# Patient Record
Sex: Female | Born: 1980 | Race: Black or African American | Hispanic: No | Smoking: Never smoker
Health system: Southern US, Community
[De-identification: ages and names within clinical notes are randomized; demographics above are authoritative.]

## PROBLEM LIST (undated history)

## (undated) DIAGNOSIS — E0590 Thyrotoxicosis, unspecified without thyrotoxic crisis or storm: Secondary | ICD-10-CM

## (undated) HISTORY — DX: Thyrotoxicosis, unspecified without thyrotoxic crisis or storm: E05.90

---

## 2019-05-05 ENCOUNTER — Ambulatory Visit: Payer: Self-pay | Admitting: "Endocrinology

## 2019-06-02 ENCOUNTER — Ambulatory Visit: Payer: Self-pay | Admitting: "Endocrinology

## 2019-06-23 ENCOUNTER — Other Ambulatory Visit: Payer: Self-pay

## 2019-06-23 ENCOUNTER — Encounter: Payer: Self-pay | Admitting: "Endocrinology

## 2019-06-23 ENCOUNTER — Ambulatory Visit (INDEPENDENT_AMBULATORY_CARE_PROVIDER_SITE_OTHER): Payer: BLUE CROSS/BLUE SHIELD | Admitting: "Endocrinology

## 2019-06-23 VITALS — BP 150/86 | HR 93 | Ht 68.0 in | Wt 261.0 lb

## 2019-06-23 DIAGNOSIS — E0590 Thyrotoxicosis, unspecified without thyrotoxic crisis or storm: Secondary | ICD-10-CM

## 2019-06-23 MED ORDER — PROPRANOLOL HCL 20 MG PO TABS: 20.0000 mg | ORAL_TABLET | Freq: Two times a day (BID) | ORAL | 2 refills | Status: DC

## 2019-06-23 NOTE — Progress Notes (Signed)
Endocrinology Consult Note    Subjective:    Patient ID: Stacy Castaneda, female    DOB: 09/27/1980, PCP Horner, Joette CatchingMargaret C, FNP.   Past Medical History:  Diagnosis Date  . Hyperthyroidism     History reviewed. No pertinent surgical history.  Social History   Socioeconomic History  . Marital status: Divorced    Spouse name: Not on file  . Number of children: Not on file  . Years of education: Not on file  . Highest education level: Not on file  Occupational History  . Not on file  Social Needs  . Financial resource strain: Not on file  . Food insecurity    Worry: Not on file    Inability: Not on file  . Transportation needs    Medical: Not on file    Non-medical: Not on file  Tobacco Use  . Smoking status: Not on file  Substance and Sexual Activity  . Alcohol use: Not on file  . Drug use: Not on file  . Sexual activity: Not on file  Lifestyle  . Physical activity    Days per week: Not on file    Minutes per session: Not on file  . Stress: Not on file  Relationships  . Social Musicianconnections    Talks on phone: Not on file    Gets together: Not on file    Attends religious service: Not on file    Active member of club or organization: Not on file    Attends meetings of clubs or organizations: Not on file    Relationship status: Not on file  Other Topics Concern  . Not on file  Social History Narrative  . Not on file    Family History  Problem Relation Age of Onset  . Diabetes Mother   . Diabetes Father     Outpatient Encounter Medications as of 06/23/2019  Medication Sig  . phentermine 37.5 MG capsule Take 37.5 mg by mouth every morning.  . furosemide (LASIX) 20 MG tablet Take 20 mg by mouth daily.  Marland Kitchen. olmesartan (BENICAR) 40 MG tablet daily.  . propranolol (INDERAL) 20 MG tablet Take 1 tablet (20 mg total) by mouth 2 (two) times daily.   No facility-administered encounter medications on file as of 06/23/2019.     ALLERGIES: Allergies  Allergen  Reactions  . Penicillins     VACCINATION STATUS:  There is no immunization history on file for this patient.   HPI  Stacy GravelRosalind Castaneda is 38 y.o. female who presents today with a medical history as above. she is being seen in consultation for hyperthyroidism requested by Horner, Joette CatchingMargaret C, FNP.  -History is obtained directly from the patient.  She no history of longstanding thyroid dysfunction.  she has been dealing with symptoms of weight loss of about 15 pounds over 6 months, palpitations, tremors, heat intolerance, and sleep disturbance progressively worsening over 6 months.  This symptoms have been progressively troubling to her functioning.  She was found to have suppressed TSH of <0.006 on January 15, 2019, and repeat thyroid function tests in May showed continued suppression of TSH and elevated total T4 of 12.8.  She had elevated titers of antithyroid antibodies.  she reports goiter, however she denies dysphagia, choking, shortness of breath, no recent voice change.   she denies family history of thyroid dysfunction, nor any family history of thyroid malignancy.   she is not on any anti-thyroid medications nor on any thyroid hormone supplements. she  is willing to  proceed with appropriate work up and therapy for thyrotoxicosis.                           Review of systems  Constitutional: + weight loss, + fatigue, + subjective hyperthermia Eyes: no blurry vision, ++ xerophthalmia ENT: no sore throat, no nodules palpated in throat, no dysphagia/odynophagia, nor hoarseness Cardiovascular: no Chest Pain, no Shortness of Breath, ++  palpitations, no leg swelling Respiratory: no cough, no SOB Gastrointestinal: no Nausea, no Vomiting, no Diarhhea Musculoskeletal: no muscle/joint aches Skin: no rashes Neurological: ++  tremors, no numbness, no tingling, no dizziness Psychiatric: no depression, ++  anxiety   Objective:    BP (!) 150/86   Pulse 93   Ht 5\' 8"  (1.727 m)   Wt 261 lb  (118.4 kg)   BMI 39.68 kg/m   Wt Readings from Last 3 Encounters:  06/23/19 261 lb (118.4 kg)                                                Physical exam  Constitutional: Body mass index is 39.68 kg/m.,  Anxious state of mind, not in acute distress Eyes: PERRLA, EOMI, - exophthalmos ENT: moist mucous membranes, ++  thyromegaly, no cervical lymphadenopathy Cardiovascular: ++ Hyperactive precordium, tachycardic, no Murmur/Rubs/Gallops Respiratory:  adequate breathing efforts, no gross chest deformity, Clear to auscultation bilaterally Gastrointestinal: abdomen soft, Non -tender, No distension, Bowel Sounds present Musculoskeletal: no gross deformities, strength intact in all four extremities Skin: moist, warm, no rashes Neurological: ++  tremor with outstretched hands,  ++ Deep Tendon Reflexes  on both lower extremities.   January 15, 2019 TSH , 0.006 Jan 22, 2019 TSH <0.006, total T4 12.8, TPO antibodies 298-elevated     Assessment & Plan:   1. Hyperthyroidism she is being seen at a kind request of Horner, 8 May, FNP. her history and most recent labs are reviewed, and she was examined clinically. Subjective and objective findings are consistent with thyrotoxicosis likely from primary hyperthyroidism. The potential risks of untreated thyrotoxicosis and the need for definitive therapy have been discussed in detail with her, and she agrees to proceed with diagnostic workup and treatment plan. -She seems to have significant thyroid hormone burden at this time, I  have emphasized the absolute need for follow-up and finish work-up and treatment.   I like to obtain confirmatory thyroid uptake and scan will be scheduled to be done as soon as possible.   Options of therapy are discussed with her.  We discussed the option of treating it with medications including methimazole or PTU which may have side effects including rash, transaminitis, and bone marrow suppression.  We  also discussed  the option of definitive therapy with RAI ablation of the thyroid, if needed.  -She understands the subsequent need for thyroid hormone replacement.  -If her thyroid uptake and scan reveals significant cold nodules, she will be considered for thyroid ultrasound before treating with I-131.  she will return in 1 week for treatment decision.   I will  initiate a temporary prescription for  Propranolol 20  mg po BID for symptomatic relief.  - I advised her to maintain close follow up with Horner, Joette Catching, FNP for primary care needs.   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 Please refer to " Patient Self Inventory" in the Media  tab for reviewed elements of pertinent patient history.  Arty Baumgartner participated in the discussions, expressed understanding, and voiced agreement with the above plans.  All questions were answered to her satisfaction. she is encouraged to contact clinic should she have any questions or concerns prior to her return visit.   Follow up plan: Return in about 1 week (around 06/30/2019), or Scan in [place] ASAP, for Follow up with Thyroid Uptake and Scan.   Thank you for involving me in the care of this pleasant patient, and I will continue to update you with her progress.  Glade Lloyd, MD [facility] Phone: [phone]  Fax: [phone]   06/23/2019, 4:33 PM  This note was partially dictated with voice recognition software. Similar sounding words can be transcribed inadequately or may not  be corrected upon review.

## 2019-07-02 ENCOUNTER — Encounter (HOSPITAL_COMMUNITY)
Admission: RE | Admit: 2019-07-02 | Discharge: 2019-07-02 | Disposition: A | Discharge status: Home / Self Care | Payer: BLUE CROSS/BLUE SHIELD | Source: Ambulatory Visit | Attending: "Endocrinology | Admitting: "Endocrinology

## 2019-07-02 ENCOUNTER — Other Ambulatory Visit: Payer: Self-pay

## 2019-07-02 DIAGNOSIS — E0590 Thyrotoxicosis, unspecified without thyrotoxic crisis or storm: Secondary | ICD-10-CM | POA: Insufficient documentation

## 2019-07-02 MED ORDER — SODIUM IODIDE I-123 7.4 MBQ CAPS
301.0000 | ORAL_CAPSULE | Freq: Once | ORAL | Status: AC
  Administered 2019-07-02: 301 via ORAL

## 2019-07-03 ENCOUNTER — Encounter (HOSPITAL_COMMUNITY)
Admission: RE | Admit: 2019-07-03 | Discharge: 2019-07-03 | Disposition: A | Discharge status: Home / Self Care | Payer: BLUE CROSS/BLUE SHIELD | Source: Ambulatory Visit | Attending: "Endocrinology | Admitting: "Endocrinology

## 2019-07-06 ENCOUNTER — Encounter: Payer: Self-pay | Admitting: "Endocrinology

## 2019-07-06 ENCOUNTER — Other Ambulatory Visit: Payer: Self-pay

## 2019-07-06 ENCOUNTER — Ambulatory Visit (INDEPENDENT_AMBULATORY_CARE_PROVIDER_SITE_OTHER): Payer: BLUE CROSS/BLUE SHIELD | Admitting: "Endocrinology

## 2019-07-06 DIAGNOSIS — E0500 Thyrotoxicosis with diffuse goiter without thyrotoxic crisis or storm: Secondary | ICD-10-CM | POA: Insufficient documentation

## 2019-07-06 DIAGNOSIS — E0590 Thyrotoxicosis, unspecified without thyrotoxic crisis or storm: Secondary | ICD-10-CM | POA: Diagnosis not present

## 2019-07-06 NOTE — Progress Notes (Signed)
07/06/2019                                Endocrinology Telehealth Visit Follow up Note -During COVID -19 Pandemic  I connected with Stacy Castaneda on 07/06/2019   by telephone and verified that I am speaking with the correct person using two identifiers. Stacy Castaneda, 12/23/1980. she has verbally consented to this visit. All issues noted in this document were discussed and addressed. The format was not optimal for physical exam.    Subjective:    Patient ID: Stacy Castaneda, female    DOB: 01/12/1981, PCP Horner, Joette CatchingMargaret C, FNP.   Past Medical History:  Diagnosis Date  . Hyperthyroidism     History reviewed. No pertinent surgical history.  Social History   Socioeconomic History  . Marital status: Divorced    Spouse name: Not on file  . Number of children: Not on file  . Years of education: Not on file  . Highest education level: Not on file  Occupational History  . Not on file  Social Needs  . Financial resource strain: Not on file  . Food insecurity    Worry: Not on file    Inability: Not on file  . Transportation needs    Medical: Not on file    Non-medical: Not on file  Tobacco Use  . Smoking status: Not on file  Substance and Sexual Activity  . Alcohol use: Not on file  . Drug use: Not on file  . Sexual activity: Not on file  Lifestyle  . Physical activity    Days per week: Not on file    Minutes per session: Not on file  . Stress: Not on file  Relationships  . Social Musicianconnections    Talks on phone: Not on file    Gets together: Not on file    Attends religious service: Not on file    Active member of club or organization: Not on file    Attends meetings of clubs or organizations: Not on file    Relationship status: Not on file  Other Topics Concern  . Not on file  Social History Narrative  . Not on file    Family History  Problem Relation Age of Onset  . Diabetes Mother   . Diabetes Father     Outpatient Encounter Medications as of  07/06/2019  Medication Sig  . furosemide (LASIX) 20 MG tablet Take 20 mg by mouth daily.  Marland Kitchen. olmesartan (BENICAR) 40 MG tablet daily.  . phentermine 37.5 MG capsule Take 37.5 mg by mouth every morning.  . propranolol (INDERAL) 20 MG tablet Take 1 tablet (20 mg total) by mouth 2 (two) times daily.   No facility-administered encounter medications on file as of 07/06/2019.     ALLERGIES: Allergies  Allergen Reactions  . Penicillins     VACCINATION STATUS:  There is no immunization history on file for this patient.   HPI  Stacy Castaneda is 38 y.o. female who is engaged in telehealth via telephone for follow-up after she was seen in consultation for hyperthyroidism requested by  Horner, Joette CatchingMargaret C, FNP.    she has been dealing with symptoms of weight loss of about 15 pounds over 6 months, palpitations, tremors, heat intolerance, and sleep disturbance progressively worsening over 6 months.  This symptoms have been progressively troubling to her functioning.  She was found to have suppressed TSH of <0.006 on January 15, 2019, and repeat thyroid function tests in May showed continued suppression of TSH and elevated total T4 of 12.8.  She had elevated titers of antithyroid antibodies. -Her previsit thyroid uptake and scan is high at 57% in 24 hours with uniform uptake consistent with Graves' disease. she reports goiter, however she denies dysphagia, choking, shortness of breath, no recent voice change.   she denies family history of thyroid dysfunction, nor any family history of thyroid malignancy.   she is not on any anti-thyroid medications nor on any thyroid hormone supplements. she  is willing to proceed with appropriate work up and therapy for thyrotoxicosis.                           Review of systems  Constitutional: + weight loss, + fatigue, + subjective hyperthermia Eyes: no blurry vision, ++ xerophthalmia ENT: no sore throat, no nodules palpated in throat, no  dysphagia/odynophagia, nor hoarseness Cardiovascular: no Chest Pain, no Shortness of Breath, ++  palpitations, no leg swelling Respiratory: no cough, no SOB Gastrointestinal: no Nausea, no Vomiting, no Diarhhea Musculoskeletal: no muscle/joint aches Skin: no rashes Neurological: ++  tremors, no numbness, no tingling, no dizziness Psychiatric: no depression, ++  anxiety   Objective:    There were no vitals taken for this visit.  Wt Readings from Last 3 Encounters:  06/23/19 261 lb (118.4 kg)                                                Physical exam  Constitutional: There is no height or weight on file to calculate BMI.,  Anxious state of mind, not in acute distress Eyes: PERRLA, EOMI, - exophthalmos ENT: moist mucous membranes, ++  thyromegaly, no cervical lymphadenopathy Cardiovascular: ++ Hyperactive precordium, tachycardic, no Murmur/Rubs/Gallops Respiratory:  adequate breathing efforts, no gross chest deformity, Clear to auscultation bilaterally Gastrointestinal: abdomen soft, Non -tender, No distension, Bowel Sounds present Musculoskeletal: no gross deformities, strength intact in all four extremities Skin: moist, warm, no rashes Neurological: ++  tremor with outstretched hands,  ++ Deep Tendon Reflexes  on both lower extremities.   January 15, 2019 TSH , 0.006 Jan 22, 2019 TSH <0.006, total T4 12.8, TPO antibodies 298-elevated   FINDINGS: Findings thyroid imaging.  4 hour I-123 uptake = 51.1% (normal 5-20%)  24 hour I-123 uptake = 57.7% (normal 10-30%)  IMPRESSION: 1. Elevated 4 hour and 24 hour radioactive iodine uptake with normal thyroid scan. Imaging findings are compatible with Graves disease.  Assessment & Plan:   1. 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 -  The potential risks of untreated thyrotoxicosis and the need for definitive therapy have been discussed in detail with  her, and she agrees to proceed with  treatment plan.  Options of therapy are discussed with her.  We discussed the option of thyroid ablation with radioactive iodine therapy which she agrees with.  We also discussed her other option of treatment with methimazole.  -I-131 thyroid ablation will be scheduled to be administered as soon as possible.  This treatment will be [facility] in [facility]. -She understands the subsequent need for thyroid hormone replacement.   she will return in 9 weeks with repeat thyroid function tests after her care.  -She is advised to continue  Propranolol 20  mg po BID for symptomatic relief.  - I advised her to maintain close follow up with Horner, Raynelle Bring, FNP for primary care needs.   Time for this visit: 15 minutes. Arty Baumgartner  participated in the discussions, expressed understanding, and voiced agreement with the above plans.  All questions were answered to her satisfaction. she is encouraged to contact clinic should she have any questions or concerns prior to her return visit.   Follow up plan: Return in about 9 weeks (around 09/07/2019) for Follow up with Labs after I131 Therapy.   Thank you for involving me in the care of this pleasant patient, and I will continue to update you with her progress.  Glade Lloyd, MD [facility]in[facility] Phone: [phone]  Fax: [phone]   07/06/2019, 4:12 PM  This note was partially dictated with voice recognition software. Similar sounding words can be transcribed inadequately or may not  be corrected upon review.

## 2019-07-08 ENCOUNTER — Telehealth: Payer: Self-pay | Admitting: "Endocrinology

## 2019-07-08 NOTE — Telephone Encounter (Signed)
pts thyroid ablation is scheduled for next week. She is requesting to be put out of work now due to high BP, and just not feeling good. I advised her that we normally dont do FMLA paperwork however she still wanted me to ask Dr Dorris Fetch.

## 2019-07-08 NOTE — Telephone Encounter (Signed)
That is right, we are doing work excuse for days of visit. She has to see PMD for high blood pressure and they may help with FMLA.

## 2019-07-08 NOTE — Telephone Encounter (Signed)
Viola a Tour manager called and left a VM that the patient is requesting time off and she said she would do the paper work but needs additional information for treatments, call her at [phone]

## 2019-07-09 NOTE — Telephone Encounter (Signed)
Called # several times. Line busy. Awaiting return call.

## 2019-07-13 ENCOUNTER — Other Ambulatory Visit: Payer: Self-pay

## 2019-07-13 ENCOUNTER — Encounter (HOSPITAL_COMMUNITY)
Admission: RE | Admit: 2019-07-13 | Discharge: 2019-07-13 | Disposition: A | Discharge status: Home / Self Care | Payer: BLUE CROSS/BLUE SHIELD | Source: Ambulatory Visit | Attending: "Endocrinology | Admitting: "Endocrinology

## 2019-07-13 DIAGNOSIS — E0500 Thyrotoxicosis with diffuse goiter without thyrotoxic crisis or storm: Secondary | ICD-10-CM | POA: Insufficient documentation

## 2019-07-13 DIAGNOSIS — E0590 Thyrotoxicosis, unspecified without thyrotoxic crisis or storm: Secondary | ICD-10-CM | POA: Insufficient documentation

## 2019-07-17 ENCOUNTER — Other Ambulatory Visit: Payer: Self-pay

## 2019-07-17 ENCOUNTER — Other Ambulatory Visit (HOSPITAL_COMMUNITY)
Admission: RE | Admit: 2019-07-17 | Discharge: 2019-07-17 | Disposition: A | Discharge status: Home / Self Care | Payer: BLUE CROSS/BLUE SHIELD | Source: Ambulatory Visit | Attending: "Endocrinology | Admitting: "Endocrinology

## 2019-07-17 ENCOUNTER — Encounter (HOSPITAL_COMMUNITY)
Admission: RE | Admit: 2019-07-17 | Discharge: 2019-07-17 | Disposition: A | Discharge status: Home / Self Care | Payer: BLUE CROSS/BLUE SHIELD | Source: Ambulatory Visit | Attending: "Endocrinology | Admitting: "Endocrinology

## 2019-07-17 DIAGNOSIS — E0590 Thyrotoxicosis, unspecified without thyrotoxic crisis or storm: Secondary | ICD-10-CM | POA: Insufficient documentation

## 2019-07-17 DIAGNOSIS — E0500 Thyrotoxicosis with diffuse goiter without thyrotoxic crisis or storm: Secondary | ICD-10-CM | POA: Diagnosis not present

## 2019-07-17 LAB — TSH: TSH: <0.01 u[IU]/mL — ABNORMAL LOW (ref 0.350–4.500)

## 2019-07-17 LAB — HCG, SERUM, QUALITATIVE: Preg, Serum: NEGATIVE

## 2019-07-17 LAB — T4, FREE: Free T4: 5.1 ng/dL — ABNORMAL HIGH (ref 0.61–1.12)

## 2019-07-17 MED ORDER — SODIUM IODIDE I 131 CAPSULE
15.0000 | Freq: Once | INTRAVENOUS | Status: AC | PRN
  Administered 2019-07-17: 15.4 via ORAL

## 2019-08-02 ENCOUNTER — Other Ambulatory Visit: Payer: Self-pay | Admitting: "Endocrinology

## 2019-09-09 ENCOUNTER — Ambulatory Visit: Payer: BLUE CROSS/BLUE SHIELD | Admitting: "Endocrinology

## 2019-10-05 ENCOUNTER — Other Ambulatory Visit: Payer: Self-pay | Admitting: "Endocrinology

## 2019-10-05 DIAGNOSIS — E0590 Thyrotoxicosis, unspecified without thyrotoxic crisis or storm: Secondary | ICD-10-CM

## 2019-10-06 ENCOUNTER — Ambulatory Visit: Payer: BLUE CROSS/BLUE SHIELD | Admitting: "Endocrinology

## 2019-10-19 ENCOUNTER — Other Ambulatory Visit: Payer: Self-pay | Admitting: "Endocrinology

## 2019-10-19 LAB — TSH: TSH: 37 — AB (ref 0.41–5.90)

## 2019-10-20 ENCOUNTER — Ambulatory Visit (INDEPENDENT_AMBULATORY_CARE_PROVIDER_SITE_OTHER): Payer: BLUE CROSS/BLUE SHIELD | Admitting: "Endocrinology

## 2019-10-20 ENCOUNTER — Encounter: Payer: Self-pay | Admitting: "Endocrinology

## 2019-10-20 DIAGNOSIS — E0500 Thyrotoxicosis with diffuse goiter without thyrotoxic crisis or storm: Secondary | ICD-10-CM | POA: Diagnosis not present

## 2019-10-20 DIAGNOSIS — E890 Postprocedural hypothyroidism: Secondary | ICD-10-CM | POA: Diagnosis not present

## 2019-10-20 LAB — T4, FREE: Free T4: 0.85 ng/dL (ref 0.82–1.77)

## 2019-10-20 LAB — TSH: TSH: 37 u[IU]/mL — ABNORMAL HIGH (ref 0.450–4.500)

## 2019-10-20 MED ORDER — LEVOTHYROXINE SODIUM 88 MCG PO TABS: 88.0000 ug | ORAL_TABLET | Freq: Every day | ORAL | 3 refills | Status: DC

## 2019-10-20 NOTE — Progress Notes (Signed)
10/20/2019                                Endocrinology Telehealth Visit Follow up Note -During COVID -19 Pandemic  I connected with Stacy Castaneda on 10/20/2019   by telephone and verified that I am speaking with the correct person using two identifiers. Stacy Castaneda, 1981/05/15. she has verbally consented to this visit. All issues noted in this document were discussed and addressed. The format was not optimal for physical exam.   Subjective:    Patient ID: Stacy Castaneda, female    DOB: Jan 24, 1981, PCP Horner, Joette Catching, FNP.   Past Medical History:  Diagnosis Date  . Hyperthyroidism     History reviewed. No pertinent surgical history.  Social History   Socioeconomic History  . Marital status: Divorced    Spouse name: Not on file  . Number of children: Not on file  . Years of education: Not on file  . Highest education level: Not on file  Occupational History  . Not on file  Tobacco Use  . Smoking status: Not on file  Substance and Sexual Activity  . Alcohol use: Not on file  . Drug use: Not on file  . Sexual activity: Not on file  Other Topics Concern  . Not on file  Social History Narrative  . Not on file   Social Determinants of Health   Financial Resource Strain:   . Difficulty of Paying Living Expenses: Not on file  Food Insecurity:   . Worried About Programme researcher, broadcasting/film/video in the Last Year: Not on file  . Ran Out of Food in the Last Year: Not on file  Transportation Needs:   . Lack of Transportation (Medical): Not on file  . Lack of Transportation (Non-Medical): Not on file  Physical Activity:   . Days of Exercise per Week: Not on file  . Minutes of Exercise per Session: Not on file  Stress:   . Feeling of Stress : Not on file  Social Connections:   . Frequency of Communication with Friends and Family: Not on file  . Frequency of Social Gatherings with Friends and Family: Not on file  . Attends Religious Services: Not on file  . Active Member of Clubs  or Organizations: Not on file  . Attends Banker Meetings: Not on file  . Marital Status: Not on file    Family History  Problem Relation Age of Onset  . Diabetes Mother   . Diabetes Father     Outpatient Encounter Medications as of 10/20/2019  Medication Sig  . furosemide (LASIX) 20 MG tablet Take 20 mg by mouth daily.  Marland Kitchen levothyroxine (SYNTHROID) 88 MCG tablet Take 1 tablet (88 mcg total) by mouth daily before breakfast.  . olmesartan (BENICAR) 40 MG tablet daily.  . phentermine 37.5 MG capsule Take 37.5 mg by mouth every morning.  . propranolol (INDERAL) 20 MG tablet TAKE 1 TABLET BY MOUTH TWICE A DAY   No facility-administered encounter medications on file as of 10/20/2019.    ALLERGIES: Allergies  Allergen Reactions  . Penicillins     VACCINATION STATUS:  There is no immunization history on file for this patient.   HPI  Stacy Castaneda is 39 y.o. female who is engaged in telehealth via telephone for follow-up after she was seen in consultation for hyperthyroidism requested by  Horner, Joette Catching, FNP.   She is status post  I-131 thyroid ablation on July 17, 2019 . Her previsit thyroid function tests are consistent with treatment effect. She has no new complaints today.  -Her previsit thyroid uptake and scan is high at 57% in 24 hours with uniform uptake consistent with Graves' disease. she reports goiter, however she denies dysphagia, choking, shortness of breath, no recent voice change.   she denies family history of thyroid dysfunction, nor any family history of thyroid malignancy.   she is not on any anti-thyroid medications nor on any thyroid hormone supplements                            Review of systems Limited as above.   Objective:    There were no vitals taken for this visit.  Wt Readings from Last 3 Encounters:  06/23/19 261 lb (118.4 kg)                              January 15, 2019 TSH , 0.006 Jan 22, 2019 TSH <0.006, total T4  12.8, TPO antibodies 298-elevated   FINDINGS: Findings thyroid imaging.  4 hour I-123 uptake = 51.1% (normal 5-20%)  24 hour I-123 uptake = 57.7% (normal 10-30%)  IMPRESSION: 1. Elevated 4 hour and 24 hour radioactive iodine uptake with normal thyroid scan. Imaging findings are compatible with Graves disease.   I-131 therapy on July 17, 2019.  Recent Results (from the past 2160 hour(s))  TSH     Status: Abnormal   Collection Time: 10/19/19 12:00 AM  Result Value Ref Range   TSH 37.00 (A) 0.41 - 5.90    Comment: free t4 0.85     Assessment & Plan:    1.  RAI-hypothyroidism 2. 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 -I discussed and initiated levothyroxine 88 mcg p.o. daily before breakfast.     - We discussed about the correct intake of her thyroid hormone, on empty stomach at fasting, with water, separated by at least 30 minutes from breakfast and other medications,  and separated by more than 4 hours from calcium, iron, multivitamins, acid reflux medications (PPIs). -Patient is made aware of the fact that thyroid hormone replacement is needed for life, dose to be adjusted by periodic monitoring of thyroid function tests.  she will return in 9 weeks with repeat thyroid function tests after her care.    - I advised her to maintain close follow up with Horner, Joette Catching, FNP for primary care needs.     - Time spent on this patient care encounter:  25 minutes of which 50% was spent in  counseling and the rest reviewing  her current and  previous labs / studies and medications  doses and developing a plan for long term care. Stacy Castaneda  participated in the discussions, expressed understanding, and voiced agreement with the above plans.  All questions were answered to her satisfaction. she is encouraged to  contact clinic should she have any questions or concerns prior to her return visit.   Follow up plan: Return in about 9 weeks (around 12/22/2019) for Follow up with Pre-visit Labs.   Thank you for involving me in the care of this pleasant patient, and I will continue to update you with her progress.  Marquis Lunch, MD [facility] Phone: [phone]  Fax: [phone]   10/20/2019, 3:39 PM  This note was partially dictated with voice recognition software. Similar sounding words can be transcribed inadequately or may not  be corrected upon review.

## 2019-11-08 ENCOUNTER — Other Ambulatory Visit: Payer: Self-pay | Admitting: "Endocrinology

## 2019-12-21 ENCOUNTER — Other Ambulatory Visit: Payer: Self-pay | Admitting: "Endocrinology

## 2019-12-22 LAB — T4, FREE: Free T4: 1.82 ng/dL — ABNORMAL HIGH (ref 0.82–1.77)

## 2019-12-22 LAB — TSH: TSH: 0.008 u[IU]/mL — ABNORMAL LOW (ref 0.450–4.500)

## 2019-12-23 ENCOUNTER — Ambulatory Visit: Payer: BLUE CROSS/BLUE SHIELD | Admitting: "Endocrinology

## 2019-12-24 ENCOUNTER — Other Ambulatory Visit: Payer: Self-pay | Admitting: "Endocrinology

## 2019-12-24 MED ORDER — LEVOTHYROXINE SODIUM 75 MCG PO TABS: 75.0000 ug | ORAL_TABLET | Freq: Every day | ORAL | 2 refills | Status: DC

## 2019-12-29 ENCOUNTER — Ambulatory Visit (INDEPENDENT_AMBULATORY_CARE_PROVIDER_SITE_OTHER): Payer: BLUE CROSS/BLUE SHIELD | Admitting: "Endocrinology

## 2019-12-29 ENCOUNTER — Other Ambulatory Visit: Payer: Self-pay

## 2019-12-29 ENCOUNTER — Encounter: Payer: Self-pay | Admitting: "Endocrinology

## 2019-12-29 VITALS — BP 129/86 | HR 80 | Ht 68.0 in | Wt 274.4 lb

## 2019-12-29 DIAGNOSIS — E0500 Thyrotoxicosis with diffuse goiter without thyrotoxic crisis or storm: Secondary | ICD-10-CM

## 2019-12-29 DIAGNOSIS — E890 Postprocedural hypothyroidism: Secondary | ICD-10-CM

## 2019-12-29 NOTE — Progress Notes (Signed)
12/29/2019      Endocrinology follow-up note    Subjective:    Patient ID: Stacy Castaneda, female    DOB: 1980/09/27, PCP Horner, Joette Catching, FNP.   Past Medical History:  Diagnosis Date  . Hyperthyroidism     History reviewed. No pertinent surgical history.  Social History   Socioeconomic History  . Marital status: Divorced    Spouse name: Not on file  . Number of children: Not on file  . Years of education: Not on file  . Highest education level: Not on file  Occupational History  . Not on file  Tobacco Use  . Smoking status: Not on file  Substance and Sexual Activity  . Alcohol use: Not on file  . Drug use: Not on file  . Sexual activity: Not on file  Other Topics Concern  . Not on file  Social History Narrative  . Not on file   Social Determinants of Health   Financial Resource Strain:   . Difficulty of Paying Living Expenses:   Food Insecurity:   . Worried About Programme researcher, broadcasting/film/video in the Last Year:   . Barista in the Last Year:   Transportation Needs:   . Freight forwarder (Medical):   Marland Kitchen Lack of Transportation (Non-Medical):   Physical Activity:   . Days of Exercise per Week:   . Minutes of Exercise per Session:   Stress:   . Feeling of Stress :   Social Connections:   . Frequency of Communication with Friends and Family:   . Frequency of Social Gatherings with Friends and Family:   . Attends Religious Services:   . Active Member of Clubs or Organizations:   . Attends Banker Meetings:   Marland Kitchen Marital Status:     Family History  Problem Relation Age of Onset  . Diabetes Mother   . Diabetes Father     Outpatient Encounter Medications as of 12/29/2019  Medication Sig  . hydrochlorothiazide (HYDRODIURIL) 25 MG tablet Take 25 mg by mouth daily.  Marland Kitchen ibuprofen (ADVIL) 600 MG tablet Take 600 mg by mouth every 8 (eight) hours as needed.  Marland Kitchen levothyroxine (SYNTHROID) 75 MCG tablet Take 1 tablet (75 mcg total) by mouth daily  before breakfast.  . [DISCONTINUED] furosemide (LASIX) 20 MG tablet Take 20 mg by mouth daily.  . [DISCONTINUED] olmesartan (BENICAR) 40 MG tablet daily.  . [DISCONTINUED] phentermine 37.5 MG capsule Take 37.5 mg by mouth every morning.   No facility-administered encounter medications on file as of 12/29/2019.    ALLERGIES: Allergies  Allergen Reactions  . Penicillins     VACCINATION STATUS:  There is no immunization history on file for this patient.   HPI  Stacy Castaneda is 39 y.o. female who is being seen in follow-up for RAI induced hypothyroidism.  PCP:  Horner, Joette Catching, FNP.   She is status post I-131 thyroid ablation on July 17, 2019 .  Prior to her last visit her thyroid function tests where consistent with treatment effect with hypothyroidism.  She was started on levothyroxine 88 mcg p.o. daily before breakfast.  She reports compliance.  Her previsit labs are consistent with slight over replacement.   she reports goiter, however she denies dysphagia, choking, shortness of breath, no recent voice change.   she denies family history of thyroid dysfunction, nor any family history of thyroid malignancy.  Review of systems Limited as above.   Objective:    BP 129/86   Pulse 80   Ht 5\' 8"  (1.727 m)   Wt 274 lb 6.4 oz (124.5 kg)   BMI 41.72 kg/m   Wt Readings from Last 3 Encounters:  12/29/19 274 lb 6.4 oz (124.5 kg)  06/23/19 261 lb (118.4 kg)          Physical Exam- Limited  Constitutional:  Body mass index is 41.72 kg/m. , not in acute distress, normal state of mind Eyes:  EOMI, no exophthalmos Neck: Supple Thyroid: + No megaly Respiratory: Adequate breathing efforts Musculoskeletal: no gross deformities, strength intact in all four extremities, no gross restriction of joint movements Skin:  no rashes, no hyperemia Neurological: no tremor with outstretched hands,                       January 15, 2019 TSH , 0.006 Jan 22, 2019  TSH <0.006, total T4 12.8, TPO antibodies 298-elevated   FINDINGS: Findings thyroid imaging.  4 hour I-123 uptake = 51.1% (normal 5-20%)  24 hour I-123 uptake = 57.7% (normal 10-30%)  IMPRESSION: 1. Elevated 4 hour and 24 hour radioactive iodine uptake with normal thyroid scan. Imaging findings are compatible with Graves disease.   I-131 therapy on July 17, 2019.  Recent Results (from the past 2160 hour(s))  TSH     Status: Abnormal   Collection Time: 10/19/19 12:00 AM  Result Value Ref Range   TSH 37.00 (A) 0.41 - 5.90    Comment: free t4 0.85  T4, free     Status: None   Collection Time: 10/19/19  3:40 PM  Result Value Ref Range   Free T4 0.85 0.82 - 1.77 ng/dL  TSH     Status: Abnormal   Collection Time: 10/19/19  3:40 PM  Result Value Ref Range   TSH 37.000 (H) 0.450 - 4.500 uIU/mL  T4, free     Status: Abnormal   Collection Time: 12/21/19  4:12 PM  Result Value Ref Range   Free T4 1.82 (H) 0.82 - 1.77 ng/dL  TSH     Status: Abnormal   Collection Time: 12/21/19  4:12 PM  Result Value Ref Range   TSH 0.008 (L) 0.450 - 4.500 uIU/mL     Assessment & Plan:    1.  RAI-hypothyroidism 2. 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  I discussed and lowered her levothyroxine to 75 mcg p.o. daily before breakfast.    - We discussed about the correct intake of her thyroid hormone, on empty stomach at fasting, with water, separated by at least 30 minutes from breakfast and other medications,  and separated by more than 4 hours from calcium, iron, multivitamins, acid reflux medications (PPIs). -Patient is made aware of the fact that thyroid hormone replacement is needed for life, dose to be adjusted by periodic monitoring of thyroid function tests.  Given her clinical goiter, she will be considered for thyroid ultrasound after her next visit.  - I advised her to maintain close  follow up with Horner, Raynelle Bring, FNP for primary care needs.      - Time spent on this patient care encounter:  25 minutes of which 50% was spent in  counseling and the rest reviewing  her current and  previous labs / studies and medications  doses and developing a plan for long term care. Arty Baumgartner  participated in the discussions, expressed understanding, and voiced agreement with the above plans.  All questions were answered to her satisfaction. she is encouraged to contact clinic should she have any questions or concerns prior to her return visit.    Follow up plan: Return in about 3 months (around 03/29/2020) for Follow up with Pre-visit Labs.   Thank you for involving me in the care of this pleasant patient, and I will continue to update you with her progress.  Marquis Lunch, MD [facility] Phone: [phone]  Fax: [phone]   12/29/2019, 5:38 PM  This note was partially dictated with voice recognition software. Similar sounding words can be transcribed inadequately or may not  be corrected upon review.

## 2020-02-16 ENCOUNTER — Other Ambulatory Visit: Payer: Self-pay | Admitting: "Endocrinology

## 2020-03-31 ENCOUNTER — Ambulatory Visit: Payer: BLUE CROSS/BLUE SHIELD | Admitting: "Endocrinology

## 2020-04-05 IMAGING — NM NM RAI THERAPY FOR HYPERTHYROIDISM
1 series · 1 of 1 positions shown · non-contrast
Comparison: Thyroid uptake and scan 07/03/2019

CLINICAL DATA: Hyperthyroidism

EXAM:
RADIOACTIVE IODINE THERAPY FOR HYPERTHYROIDISM
TECHNIQUE: Procedure, benefits and risks of radioactive iodine therapy for
hyperthyroidism were discussed with the patient, as well as
alternatives.

[Series 1: bone statics · 2.07mm/px · 1 of 1 slices shown]
[im 1/1]
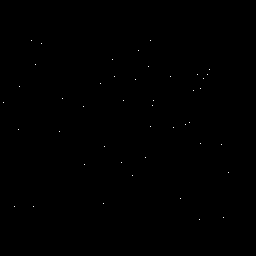

[1 of 1 positions shown; findings below may reference images not displayed]

Radiation safety issues and procedures were discussed, including
minimization of radiation risk to family and general public.

Written safety instructions were provided to the patient.

Patient's questions were answered.

Written informed consent for the therapy was obtained.

Timeout protocol followed.

Patient then received the radiopharmaceutical without immediate
complication.

RADIOPHARMACEUTICALS:  15.4 mCi D-BCB sodium iodide orally
IMPRESSION: Per oral administration of D-BCB sodium iodide for the treatment of
hyperthyroidism.

## 2020-04-20 ENCOUNTER — Ambulatory Visit: Payer: BLUE CROSS/BLUE SHIELD | Admitting: "Endocrinology

## 2020-06-14 ENCOUNTER — Other Ambulatory Visit: Payer: Self-pay

## 2020-06-14 DIAGNOSIS — E0500 Thyrotoxicosis with diffuse goiter without thyrotoxic crisis or storm: Secondary | ICD-10-CM

## 2020-06-14 DIAGNOSIS — E890 Postprocedural hypothyroidism: Secondary | ICD-10-CM

## 2020-08-20 LAB — TSH: TSH: 9.93 u[IU]/mL — ABNORMAL HIGH (ref 0.450–4.500)

## 2020-08-20 LAB — T4, FREE: Free T4: 1.12 ng/dL (ref 0.82–1.77)

## 2020-08-29 ENCOUNTER — Other Ambulatory Visit: Payer: Self-pay

## 2020-08-29 ENCOUNTER — Encounter: Payer: Self-pay | Admitting: "Endocrinology

## 2020-08-29 ENCOUNTER — Ambulatory Visit (INDEPENDENT_AMBULATORY_CARE_PROVIDER_SITE_OTHER): Payer: BLUE CROSS/BLUE SHIELD | Admitting: "Endocrinology

## 2020-08-29 VITALS — BP 128/64 | HR 68 | Ht 68.0 in | Wt 272.0 lb

## 2020-08-29 DIAGNOSIS — E890 Postprocedural hypothyroidism: Secondary | ICD-10-CM

## 2020-08-29 MED ORDER — LEVOTHYROXINE SODIUM 100 MCG PO TABS: 100.0000 ug | ORAL_TABLET | Freq: Every day | ORAL | 1 refills | Status: DC

## 2020-08-29 NOTE — Progress Notes (Signed)
08/29/2020     Endocrinology follow-up note   Subjective:    Patient ID: Stacy Castaneda, female    DOB: 1981/09/08, PCP Horner, Joette Catching, FNP.   Past Medical History:  Diagnosis Date  . Hyperthyroidism     History reviewed. No pertinent surgical history.  Social History   Socioeconomic History  . Marital status: Divorced    Spouse name: Not on file  . Number of children: Not on file  . Years of education: Not on file  . Highest education level: Not on file  Occupational History  . Not on file  Tobacco Use  . Smoking status: Not on file  . Smokeless tobacco: Not on file  Substance and Sexual Activity  . Alcohol use: Not on file  . Drug use: Not on file  . Sexual activity: Not on file  Other Topics Concern  . Not on file  Social History Narrative  . Not on file   Social Determinants of Health   Financial Resource Strain: Not on file  Food Insecurity: Not on file  Transportation Needs: Not on file  Physical Activity: Not on file  Stress: Not on file  Social Connections: Not on file    Family History  Problem Relation Age of Onset  . Diabetes Mother   . Diabetes Father     Outpatient Encounter Medications as of 08/29/2020  Medication Sig  . hydrochlorothiazide (HYDRODIURIL) 25 MG tablet Take 25 mg by mouth daily.  Marland Kitchen ibuprofen (ADVIL) 600 MG tablet Take 600 mg by mouth every 8 (eight) hours as needed.  Marland Kitchen levothyroxine (SYNTHROID) 100 MCG tablet Take 1 tablet (100 mcg total) by mouth daily before breakfast.  . VICTOZA 18 MG/3ML SOPN Inject 1.8 mg into the skin at bedtime.  . [DISCONTINUED] levothyroxine (SYNTHROID) 75 MCG tablet TAKE 1 TABLET BY MOUTH EVERY DAY BEFORE BREAKFAST   No facility-administered encounter medications on file as of 08/29/2020.    ALLERGIES: Allergies  Allergen Reactions  . Penicillins     VACCINATION STATUS:  There is no immunization history on file for this patient.   HPI  Stacy Castaneda is 39 y.o. female who is  being seen in follow-up for RAI induced hypothyroidism.  PCP:  Horner, Joette Catching, FNP.   She is status post I-131 thyroid ablation on July 17, 2019 .  She was initiated on levothyroxine 75 mcg p.o. daily before breakfast.  She reports about 90% consistency, has no new complaints.  Her previous symptoms of hyperthyroidism have resolved.   she reports goiter, however she denies dysphagia, choking, shortness of breath, no recent voice change.   she denies family history of thyroid dysfunction, nor any family history of thyroid malignancy.                           Review of systems Limited as above.   Objective:    BP 128/64   Pulse 68   Ht 5\' 8"  (1.727 m)   Wt 272 lb (123.4 kg)   BMI 41.36 kg/m   Wt Readings from Last 3 Encounters:  08/29/20 272 lb (123.4 kg)  12/29/19 274 lb 6.4 oz (124.5 kg)  06/23/19 261 lb (118.4 kg)          Physical Exam- Limited Limited as above.      January 15, 2019 TSH , 0.006 Jan 22, 2019 TSH <0.006, total T4 12.8, TPO antibodies 298-elevated   FINDINGS: Findings thyroid imaging.  4 hour  I-123 uptake = 51.1% (normal 5-20%)  24 hour I-123 uptake = 57.7% (normal 10-30%)  IMPRESSION: 1. Elevated 4 hour and 24 hour radioactive iodine uptake with normal thyroid scan. Imaging findings are compatible with Graves disease.   I-131 therapy on July 17, 2019.  Recent Results (from the past 2160 hour(s))  TSH     Status: Abnormal   Collection Time: 08/19/20  4:34 PM  Result Value Ref Range   TSH 9.930 (H) 0.450 - 4.500 uIU/mL  T4, Free     Status: None   Collection Time: 08/19/20  4:34 PM  Result Value Ref Range   Free T4 1.12 0.82 - 1.77 ng/dL     Assessment & Plan:    1.  RAI-hypothyroidism 2. 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  She is advised to be more consistent, and will benefit from slight increase in her levothyroxine.  I discussed  and prescribed levothyroxine 100 mcg p.o. daily before breakfast.     - We discussed about the correct intake of her thyroid hormone, on empty stomach at fasting, with water, separated by at least 30 minutes from breakfast and other medications,  and separated by more than 4 hours from calcium, iron, multivitamins, acid reflux medications (PPIs). -Patient is made aware of the fact that thyroid hormone replacement is needed for life, dose to be adjusted by periodic monitoring of thyroid function tests.  Given her clinical goiter, she will be considered for thyroid ultrasound after her next visit.  - I advised her to maintain close follow up with Horner, Joette Catching, FNP for primary care needs.     - Time spent on this patient care encounter:  20 minutes of which 50% was spent in  counseling and the rest reviewing  her current and  previous labs / studies and medications  doses and developing a plan for long term care. Daine Gravel  participated in the discussions, expressed understanding, and voiced agreement with the above plans.  All questions were answered to her satisfaction. she is encouraged to contact clinic should she have any questions or concerns prior to her return visit.    Follow up plan: Return in about 4 months (around 12/28/2020) for F/U with Pre-visit Labs.   Thank you for involving me in the care of this pleasant patient, and I will continue to update you with her progress.  Marquis Lunch, MD [facility] Phone: [phone]  Fax: [phone]   08/29/2020, 1:37 PM  This note was partially dictated with voice recognition software. Similar sounding words can be transcribed inadequately or may not  be corrected upon review.

## 2020-12-28 ENCOUNTER — Ambulatory Visit: Payer: BLUE CROSS/BLUE SHIELD | Admitting: "Endocrinology

## 2021-01-25 LAB — T4, FREE: Free T4: 0.8 ng/dL — ABNORMAL LOW (ref 0.82–1.77)

## 2021-01-25 LAB — TSH: TSH: 24.8 u[IU]/mL — ABNORMAL HIGH (ref 0.450–4.500)

## 2021-01-25 NOTE — Telephone Encounter (Signed)
error 

## 2021-03-06 ENCOUNTER — Encounter: Payer: Self-pay | Admitting: "Endocrinology

## 2021-03-06 ENCOUNTER — Telehealth: Payer: Self-pay

## 2021-03-06 ENCOUNTER — Ambulatory Visit (INDEPENDENT_AMBULATORY_CARE_PROVIDER_SITE_OTHER): Payer: Medicaid Other | Admitting: "Endocrinology

## 2021-03-06 ENCOUNTER — Other Ambulatory Visit: Payer: Self-pay

## 2021-03-06 VITALS — BP 122/84 | HR 76 | Ht 68.0 in | Wt 281.0 lb

## 2021-03-06 DIAGNOSIS — E0500 Thyrotoxicosis with diffuse goiter without thyrotoxic crisis or storm: Secondary | ICD-10-CM

## 2021-03-06 DIAGNOSIS — E890 Postprocedural hypothyroidism: Secondary | ICD-10-CM

## 2021-03-06 MED ORDER — LEVOTHYROXINE SODIUM 125 MCG PO TABS: 125.0000 ug | ORAL_TABLET | Freq: Every day | ORAL | 1 refills | Status: DC

## 2021-03-06 NOTE — Telephone Encounter (Signed)
Pt made aware we do not accept her [employer]. She said she would still like to be seen as self pay

## 2021-03-06 NOTE — Progress Notes (Signed)
03/06/2021     Endocrinology follow-up note   Subjective:    Patient ID: Stacy Castaneda, female    DOB: [birth year], PCP Horner, Joette Catching, FNP.   Past Medical History:  Diagnosis Date   Hyperthyroidism     History reviewed. No pertinent surgical history.  Social History   Socioeconomic History   Marital status: Divorced    Spouse name: Not on file   Number of children: Not on file   Years of education: Not on file   Highest education level: Not on file  Occupational History   Not on file  Tobacco Use   Smoking status: Never   Smokeless tobacco: Not on file  Substance and Sexual Activity   Alcohol use: Not on file   Drug use: Not on file   Sexual activity: Not on file  Other Topics Concern   Not on file  Social History Narrative   Not on file   Social Determinants of Health   Financial Resource Strain: Not on file  Food Insecurity: Not on file  Transportation Needs: Not on file  Physical Activity: Not on file  Stress: Not on file  Social Connections: Not on file    Family History  Problem Relation Age of Onset   Diabetes Mother    Diabetes Father     Outpatient Encounter Medications as of 03/06/2021  Medication Sig   hydrochlorothiazide (HYDRODIURIL) 25 MG tablet Take 25 mg by mouth daily.   ibuprofen (ADVIL) 600 MG tablet Take 600 mg by mouth every 8 (eight) hours as needed.   levothyroxine (SYNTHROID) 125 MCG tablet Take 1 tablet (125 mcg total) by mouth daily before breakfast.   VICTOZA 18 MG/3ML SOPN Inject 1.8 mg into the skin at bedtime.   [DISCONTINUED] levothyroxine (SYNTHROID) 100 MCG tablet Take 1 tablet (100 mcg total) by mouth daily before breakfast.   No facility-administered encounter medications on file as of 03/06/2021.    ALLERGIES: Allergies  Allergen Reactions   Penicillins     VACCINATION STATUS:  There is no immunization history on file for this patient.   HPI  Stacy Castaneda is 40 y.o. female who is being seen in  follow-up for RAI induced hypothyroidism.  PCP:  Horner, Joette Catching, FNP.  She is currently on levothyroxine 100 mcg p.o. daily before breakfast.  She missed her appointment in April. She is status post I-131 thyroid ablation on July 17, 2019 .     She reports about 90% consistency, has no new complaints.  Her previous symptoms of hyperthyroidism have resolved.   she reports goiter, however she denies dysphagia, choking, shortness of breath, no recent voice change.   she denies family history of thyroid dysfunction, nor any family history of thyroid malignancy.                           Review of systems Limited as above.   Objective:    BP 122/84   Pulse 76   Ht 5\' 8"  (1.727 m)   Wt 281 lb (127.5 kg)   BMI 42.73 kg/m   Wt Readings from Last 3 Encounters:  03/06/21 281 lb (127.5 kg)  08/29/20 272 lb (123.4 kg)  12/29/19 274 lb 6.4 oz (124.5 kg)          Physical Exam- Limited Limited as above.      January 15, 2019 TSH , 0.006 Jan 22, 2019 TSH <0.006, total T4 12.8, TPO antibodies 298-elevated  FINDINGS: Findings thyroid imaging.   4 hour I-123 uptake = 51.1% (normal 5-20%)   24 hour I-123 uptake = 57.7% (normal 10-30%)   IMPRESSION: 1. Elevated 4 hour and 24 hour radioactive iodine uptake with normal thyroid scan. Imaging findings are compatible with Graves disease.   I-131 therapy on July 17, 2019.  Recent Results (from the past 2160 hour(s))  TSH     Status: Abnormal   Collection Time: 01/24/21  2:58 PM  Result Value Ref Range   TSH 24.800 (H) 0.450 - 4.500 uIU/mL  T4, free     Status: Abnormal   Collection Time: 01/24/21  2:58 PM  Result Value Ref Range   Free T4 0.80 (L) 0.82 - 1.77 ng/dL     Assessment & Plan:    1.  RAI-hypothyroidism 2. 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  I emphasized consistency taking her medication, however will need an increase in  the dose of her levothyroxine to 125 mcg p.o. daily before breakfast.    - We discussed about the correct intake of her thyroid hormone, on empty stomach at fasting, with water, separated by at least 30 minutes from breakfast and other medications,  and separated by more than 4 hours from calcium, iron, multivitamins, acid reflux medications (PPIs). -Patient is made aware of the fact that thyroid hormone replacement is needed for life, dose to be adjusted by periodic monitoring of thyroid function tests.  Given her clinical goiter, she will be considered for thyroid ultrasound after her next visit.  - I advised her to maintain close follow up with Horner, Joette Catching, FNP for primary Castaneda needs.    I spent 25 minutes in the Castaneda of the patient today including review of labs from Thyroid Function, CMP, and other relevant labs ; imaging/biopsy records (current and previous including abstractions from other facilities); face-to-face time discussing  her lab results and symptoms, medications doses, her options of short and long term treatment based on the latest standards of Castaneda / guidelines;   and documenting the encounter.  Daine Gravel  participated in the discussions, expressed understanding, and voiced agreement with the above plans.  All questions were answered to her satisfaction. she is encouraged to contact clinic should she have any questions or concerns prior to her return visit.    Follow up plan: Return in about 4 months (around 07/06/2021) for F/U with Pre-visit Labs.   Thank you for involving me in the Castaneda of this pleasant patient, and I will continue to update you with her progress.  Marquis Lunch, MD [facility] Phone: [phone]  Fax: [phone]   03/06/2021, 10:35 AM  This note was partially dictated with voice recognition software. Similar sounding words can be transcribed inadequately or may not  be corrected upon  review.

## 2021-07-07 ENCOUNTER — Ambulatory Visit: Payer: Medicaid Other | Admitting: "Endocrinology

## 2021-07-17 ENCOUNTER — Ambulatory Visit: Payer: Medicaid Other | Admitting: "Endocrinology

## 2021-09-11 ENCOUNTER — Other Ambulatory Visit: Payer: Self-pay | Admitting: "Endocrinology

## 2021-10-04 LAB — T4, FREE: Free T4: 1.38 ng/dL (ref 0.82–1.77)

## 2021-10-04 LAB — TSH: TSH: 23.7 u[IU]/mL — ABNORMAL HIGH (ref 0.450–4.500)

## 2021-10-05 ENCOUNTER — Ambulatory Visit (INDEPENDENT_AMBULATORY_CARE_PROVIDER_SITE_OTHER): Payer: Medicaid Other | Admitting: "Endocrinology

## 2021-10-05 ENCOUNTER — Encounter: Payer: Self-pay | Admitting: "Endocrinology

## 2021-10-05 ENCOUNTER — Other Ambulatory Visit: Payer: Self-pay

## 2021-10-05 VITALS — BP 116/78 | HR 60 | Ht 68.0 in | Wt 283.0 lb

## 2021-10-05 DIAGNOSIS — E890 Postprocedural hypothyroidism: Secondary | ICD-10-CM

## 2021-10-05 DIAGNOSIS — E0500 Thyrotoxicosis with diffuse goiter without thyrotoxic crisis or storm: Secondary | ICD-10-CM

## 2021-10-05 MED ORDER — LEVOTHYROXINE SODIUM 150 MCG PO TABS: 150.0000 ug | ORAL_TABLET | Freq: Every day | ORAL | 1 refills | Status: DC

## 2021-10-05 NOTE — Progress Notes (Signed)
10/05/2021     Endocrinology follow-up note   Subjective:    Patient ID: Stacy Castaneda, female    DOB: 05-Mar-1981, PCP Horner, Joette Catching, FNP.   Past Medical History:  Diagnosis Date   Hyperthyroidism     History reviewed. No pertinent surgical history.  Social History   Socioeconomic History   Marital status: Divorced    Spouse name: Not on file   Number of children: Not on file   Years of education: Not on file   Highest education level: Not on file  Occupational History   Not on file  Tobacco Use   Smoking status: Never   Smokeless tobacco: Not on file  Substance and Sexual Activity   Alcohol use: Not on file   Drug use: Not on file   Sexual activity: Not on file  Other Topics Concern   Not on file  Social History Narrative   Not on file   Social Determinants of Health   Financial Resource Strain: Not on file  Food Insecurity: Not on file  Transportation Needs: Not on file  Physical Activity: Not on file  Stress: Not on file  Social Connections: Not on file    Family History  Problem Relation Age of Onset   Diabetes Mother    Diabetes Father     Outpatient Encounter Medications as of 10/05/2021  Medication Sig   hydrochlorothiazide (HYDRODIURIL) 25 MG tablet Take 25 mg by mouth daily.   ibuprofen (ADVIL) 600 MG tablet Take 600 mg by mouth every 8 (eight) hours as needed.   levothyroxine (SYNTHROID) 150 MCG tablet Take 1 tablet (150 mcg total) by mouth daily before breakfast.   VICTOZA 18 MG/3ML SOPN Inject 1.8 mg into the skin at bedtime.   [DISCONTINUED] levothyroxine (SYNTHROID) 125 MCG tablet Take 1 tablet (125 mcg total) by mouth daily before breakfast.   No facility-administered encounter medications on file as of 10/05/2021.    ALLERGIES: Allergies  Allergen Reactions   Penicillins     VACCINATION STATUS:  There is no immunization history on file for this patient.   HPI  Stacy Castaneda is 41 y.o. female who is being seen in  follow-up for RAI induced hypothyroidism.  PCP:  Horner, Joette Catching, FNP.  She is currently on levothyroxine 125 mcg p.o. daily before breakfast.  She reports better consistency taking her medication.  Her previsit labs are consistent with slight under replacement.  She has no new complaints today. She is status post I-131 thyroid ablation on July 17, 2019 .     She reports about 90% consistency, has no new complaints.  Her previous symptoms of hyperthyroidism have resolved.   she reports goiter, however she denies dysphagia, choking, shortness of breath, no recent voice change.   she denies family history of thyroid dysfunction, nor any family history of thyroid malignancy.                           Review of systems Limited as above.   Objective:    BP 116/78    Pulse 60    Ht 5\' 8"  (1.727 m)    Wt 283 lb (128.4 kg)    BMI 43.03 kg/m   Wt Readings from Last 3 Encounters:  10/05/21 283 lb (128.4 kg)  03/06/21 281 lb (127.5 kg)  08/29/20 272 lb (123.4 kg)          Physical Exam- Limited Limited as above.  January 15, 2019 TSH , 0.006 Jan 22, 2019 TSH <0.006, total T4 12.8, TPO antibodies 298-elevated   FINDINGS: Findings thyroid imaging.   4 hour I-123 uptake = 51.1% (normal 5-20%)   24 hour I-123 uptake = 57.7% (normal 10-30%)   IMPRESSION: 1. Elevated 4 hour and 24 hour radioactive iodine uptake with normal thyroid scan. Imaging findings are compatible with Graves disease.   I-131 therapy on July 17, 2019.  Recent Results (from the past 2160 hour(s))  TSH     Status: Abnormal   Collection Time: 10/03/21  2:41 PM  Result Value Ref Range   TSH 23.700 (H) 0.450 - 4.500 uIU/mL  T4, free     Status: None   Collection Time: 10/03/21  2:41 PM  Result Value Ref Range   Free T4 1.38 0.82 - 1.77 ng/dL     Assessment & Plan:    1.  RAI-hypothyroidism 2. 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  I discussed and increase her levothyroxine to 150 mcg p.o. daily before breakfast.    - We discussed about the correct intake of her thyroid hormone, on empty stomach at fasting, with water, separated by at least 30 minutes from breakfast and other medications,  and separated by more than 4 hours from calcium, iron, multivitamins, acid reflux medications (PPIs). -Patient is made aware of the fact that thyroid hormone replacement is needed for life, dose to be adjusted by periodic monitoring of thyroid function tests.   Given her clinical goiter, she will be considered for thyroid ultrasound after her next visit.  - I advised her to maintain close follow up with Horner, Joette Catching, FNP for primary care needs.   I spent 21 minutes in the care of the patient today including review of labs from Thyroid Function, CMP, and other relevant labs ; imaging/biopsy records (current and previous including abstractions from other facilities); face-to-face time discussing  her lab results and symptoms, medications doses, her options of short and long term treatment based on the latest standards of care / guidelines;   and documenting the encounter.  Daine Gravel  participated in the discussions, expressed understanding, and voiced agreement with the above plans.  All questions were answered to her satisfaction. she is encouraged to contact clinic should she have any questions or concerns prior to her return visit.     Follow up plan: Return in about 4 months (around 02/02/2022) for F/U with Pre-visit Labs.   Thank you for involving me in the care of this pleasant patient, and I will continue to update you with her progress.  Marquis Lunch, MD [facility] Phone: [phone]  Fax: [phone]   10/05/2021, 6:40 PM  This note was partially dictated with voice recognition software. Similar sounding words can be transcribed inadequately  or may not  be corrected upon review.

## 2021-10-25 ENCOUNTER — Other Ambulatory Visit: Payer: Self-pay | Admitting: "Endocrinology

## 2022-02-05 ENCOUNTER — Ambulatory Visit: Payer: Medicaid Other | Admitting: "Endocrinology

## 2022-05-08 LAB — TSH: TSH: 14.9 u[IU]/mL — ABNORMAL HIGH (ref 0.450–4.500)

## 2022-05-08 LAB — T4, FREE: Free T4: 1.19 ng/dL (ref 0.82–1.77)

## 2022-05-28 ENCOUNTER — Ambulatory Visit: Payer: Medicaid Other | Admitting: "Endocrinology

## 2022-06-07 ENCOUNTER — Encounter: Payer: Self-pay | Admitting: "Endocrinology

## 2022-06-07 ENCOUNTER — Ambulatory Visit (INDEPENDENT_AMBULATORY_CARE_PROVIDER_SITE_OTHER): Payer: 59 | Admitting: "Endocrinology

## 2022-06-07 VITALS — BP 136/90 | HR 68 | Ht 68.0 in | Wt 300.0 lb

## 2022-06-07 DIAGNOSIS — E890 Postprocedural hypothyroidism: Secondary | ICD-10-CM

## 2022-06-07 MED ORDER — LEVOTHYROXINE SODIUM 175 MCG PO TABS: 175.0000 ug | ORAL_TABLET | Freq: Every day | ORAL | 1 refills | Status: DC

## 2022-06-07 NOTE — Progress Notes (Signed)
06/07/2022     Endocrinology follow-up note   Subjective:    Patient ID: Stacy Castaneda, female    DOB: 1980/11/03, PCP Horner, Joette Catching, FNP.   Past Medical History:  Diagnosis Date   Hyperthyroidism     History reviewed. No pertinent surgical history.  Social History   Socioeconomic History   Marital status: Divorced    Spouse name: Not on file   Number of children: Not on file   Years of education: Not on file   Highest education level: Not on file  Occupational History   Not on file  Tobacco Use   Smoking status: Never   Smokeless tobacco: Not on file  Substance and Sexual Activity   Alcohol use: Not on file   Drug use: Not on file   Sexual activity: Not on file  Other Topics Concern   Not on file  Social History Narrative   Not on file   Social Determinants of Health   Financial Resource Strain: Not on file  Food Insecurity: Not on file  Transportation Needs: Not on file  Physical Activity: Not on file  Stress: Not on file  Social Connections: Not on file    Family History  Problem Relation Age of Onset   Diabetes Mother    Diabetes Father     Outpatient Encounter Medications as of 06/07/2022  Medication Sig   ibuprofen (ADVIL) 600 MG tablet Take 600 mg by mouth every 8 (eight) hours as needed.   levothyroxine (SYNTHROID) 175 MCG tablet Take 1 tablet (175 mcg total) by mouth daily before breakfast.   olmesartan (BENICAR) 40 MG tablet Take 40 mg by mouth daily.   VICTOZA 18 MG/3ML SOPN Inject 1.8 mg into the skin at bedtime.   [DISCONTINUED] hydrochlorothiazide (HYDRODIURIL) 25 MG tablet Take 25 mg by mouth daily.   [DISCONTINUED] levothyroxine (SYNTHROID) 150 MCG tablet Take 1 tablet (150 mcg total) by mouth daily before breakfast.   No facility-administered encounter medications on file as of 06/07/2022.    ALLERGIES: Allergies  Allergen Reactions   Penicillins     VACCINATION STATUS:  There is no immunization history on file for  this patient.   HPI  Stacy Castaneda is 41 y.o. female who is being seen in follow-up for RAI induced hypothyroidism.  PCP:  Horner, Joette Catching, FNP.  She is currently on levothyroxine 150 mcg mcg p.o. daily before breakfast.  She reports better consistency taking her medication.  Her previsit labs are consistent with slight under replacement.    She has no new complaints today. She is status post I-131 thyroid ablation on July 17, 2019 .     She reports about 90% consistency, has no new complaints.  Her previous symptoms of hyperthyroidism have resolved.   she reports goiter, however she denies dysphagia, choking, shortness of breath, no recent voice change.   she denies family history of thyroid dysfunction, nor any family history of thyroid malignancy.                           Review of systems Limited as above.   Objective:    BP (!) 136/90   Pulse 68   Ht 5\' 8"  (1.727 m)   Wt 300 lb (136.1 kg)   BMI 45.61 kg/m   Wt Readings from Last 3 Encounters:  06/07/22 300 lb (136.1 kg)  10/05/21 283 lb (128.4 kg)  03/06/21 281 lb (127.5 kg)  Physical Exam- Limited Limited as above.      January 15, 2019 TSH , 0.006 Jan 22, 2019 TSH <0.006, total T4 12.8, TPO antibodies 298-elevated   FINDINGS: Findings thyroid imaging.   4 hour I-123 uptake = 51.1% (normal 5-20%)   24 hour I-123 uptake = 57.7% (normal 10-30%)   IMPRESSION: 1. Elevated 4 hour and 24 hour radioactive iodine uptake with normal thyroid scan. Imaging findings are compatible with Graves disease.   I-131 therapy on July 17, 2019.  Recent Results (from the past 2160 hour(s))  TSH     Status: Abnormal   Collection Time: 05/07/22  1:28 PM  Result Value Ref Range   TSH 14.900 (H) 0.450 - 4.500 uIU/mL  T4, free     Status: None   Collection Time: 05/07/22  1:28 PM  Result Value Ref Range   Free T4 1.19 0.82 - 1.77 ng/dL     Assessment & Plan:    1.  RAI-hypothyroidism 2. Hyperthyroidism  due to Graves' disease-resolved -Her previsit thyroid function tests are consistent with under replacement.  I discussed and increase her levothyroxine 175 mcg p.o. daily before breakfast.  - We discussed about the correct intake of her thyroid hormone, on empty stomach at fasting, with water, separated by at least 30 minutes from breakfast and other medications,  and separated by more than 4 hours from calcium, iron, multivitamins, acid reflux medications (PPIs). -Patient is made aware of the fact that thyroid hormone replacement is needed for life, dose to be adjusted by periodic monitoring of thyroid function tests.    Given her clinical goiter, she will be considered for thyroid ultrasound after her next visit.  - I advised her to maintain close follow up with Horner, Raynelle Bring, FNP for primary care needs.  I spent 21 minutes in the care of the patient today including review of labs from Thyroid Function, CMP, and other relevant labs ; imaging/biopsy records (current and previous including abstractions from other facilities); face-to-face time discussing  her lab results and symptoms, medications doses, her options of short and long term treatment based on the latest standards of care / guidelines;   and documenting the encounter.  Arty Baumgartner  participated in the discussions, expressed understanding, and voiced agreement with the above plans.  All questions were answered to her satisfaction. she is encouraged to contact clinic should she have any questions or concerns prior to her return visit.   Follow up plan: Return in about 3 months (around 09/06/2022) for F/U with Pre-visit Labs.   Thank you for involving me in the care of this pleasant patient, and I will continue to update you with her progress.  Glade Lloyd, MD [facility] [place] [facility] Phone: [phone]  Fax: [phone]   06/07/2022, 5:57 PM  This note was partially dictated with  voice recognition software. Similar sounding words can be transcribed inadequately or may not  be corrected upon review.

## 2022-09-06 ENCOUNTER — Ambulatory Visit: Payer: 59 | Admitting: "Endocrinology

## 2023-05-06 ENCOUNTER — Other Ambulatory Visit: Payer: Self-pay | Admitting: "Endocrinology

## 2023-05-06 DIAGNOSIS — E890 Postprocedural hypothyroidism: Secondary | ICD-10-CM

## 2023-05-06 DIAGNOSIS — E785 Hyperlipidemia, unspecified: Secondary | ICD-10-CM

## 2023-05-06 DIAGNOSIS — E559 Vitamin D deficiency, unspecified: Secondary | ICD-10-CM

## 2023-05-09 ENCOUNTER — Other Ambulatory Visit: Payer: Self-pay | Admitting: "Endocrinology

## 2023-07-03 ENCOUNTER — Ambulatory Visit: Payer: 59 | Admitting: "Endocrinology

## 2023-07-03 LAB — LIPID PANEL
Chol/HDL Ratio: 5 {ratio} — ABNORMAL HIGH (ref 0.0–4.4)
Cholesterol, Total: 205 mg/dL — ABNORMAL HIGH (ref 100–199)
HDL: 41 mg/dL (ref >39)
LDL Chol Calc (NIH): 154 mg/dL — ABNORMAL HIGH (ref 0–99)
Triglycerides: 56 mg/dL (ref 0–149)
VLDL Cholesterol Cal: 10 mg/dL (ref 5–40)

## 2023-07-03 LAB — VITAMIN D 25 HYDROXY (VIT D DEFICIENCY, FRACTURES): Vit D, 25-Hydroxy: 20.3 ng/mL — ABNORMAL LOW (ref 30.0–100.0)

## 2023-07-03 LAB — T4, FREE: Free T4: 0.88 ng/dL (ref 0.82–1.77)

## 2023-07-03 LAB — TSH: TSH: 40.5 u[IU]/mL — ABNORMAL HIGH (ref 0.450–4.500)

## 2023-07-23 ENCOUNTER — Telehealth: Payer: Self-pay | Admitting: "Endocrinology

## 2023-07-23 DIAGNOSIS — E559 Vitamin D deficiency, unspecified: Secondary | ICD-10-CM

## 2023-07-23 DIAGNOSIS — E785 Hyperlipidemia, unspecified: Secondary | ICD-10-CM

## 2023-07-23 DIAGNOSIS — E890 Postprocedural hypothyroidism: Secondary | ICD-10-CM

## 2023-07-23 NOTE — Telephone Encounter (Signed)
Lab orders updated and sent to [place].

## 2023-07-23 NOTE — Telephone Encounter (Signed)
Can someone put in labs again.  Pt had to reschedule for Feb.  Mail them to pt

## 2023-10-24 ENCOUNTER — Ambulatory Visit: Payer: Self-pay | Admitting: "Endocrinology
